# Patient Record
Sex: Male | Born: 1990 | Hispanic: No | Marital: Single
Health system: Southern US, Community
[De-identification: ages and names within clinical notes are randomized; demographics above are authoritative.]

## PROBLEM LIST (undated history)

## (undated) DIAGNOSIS — S069XAA Unspecified intracranial injury with loss of consciousness status unknown, initial encounter: Secondary | ICD-10-CM

## (undated) DIAGNOSIS — N289 Disorder of kidney and ureter, unspecified: Secondary | ICD-10-CM

## (undated) DIAGNOSIS — N200 Calculus of kidney: Secondary | ICD-10-CM

## (undated) HISTORY — DX: Disorder of kidney and ureter, unspecified: N28.9

## (undated) HISTORY — PX: RENAL ARTERY STENT: SHX2321

## (undated) HISTORY — DX: Calculus of kidney: N20.0

## (undated) HISTORY — PX: TONSILLECTOMY AND ADENOIDECTOMY: SUR1326

## (undated) HISTORY — DX: Unspecified intracranial injury with loss of consciousness status unknown, initial encounter: S06.9XAA

## (undated) HISTORY — PX: APPENDECTOMY: SHX54

---
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
## 2018-09-04 DIAGNOSIS — R000 Tachycardia, unspecified: Secondary | ICD-10-CM | POA: Diagnosis not present

## 2018-09-04 DIAGNOSIS — F1010 Alcohol abuse, uncomplicated: Secondary | ICD-10-CM

## 2018-09-04 DIAGNOSIS — L02419 Cutaneous abscess of limb, unspecified: Secondary | ICD-10-CM

## 2018-09-04 DIAGNOSIS — Z720 Tobacco use: Secondary | ICD-10-CM

## 2018-09-04 DIAGNOSIS — L03114 Cellulitis of left upper limb: Secondary | ICD-10-CM

## 2018-09-04 DIAGNOSIS — A419 Sepsis, unspecified organism: Secondary | ICD-10-CM | POA: Diagnosis not present

## 2018-09-05 DIAGNOSIS — L02419 Cutaneous abscess of limb, unspecified: Secondary | ICD-10-CM | POA: Diagnosis not present

## 2018-09-05 DIAGNOSIS — Z720 Tobacco use: Secondary | ICD-10-CM | POA: Diagnosis not present

## 2018-09-05 DIAGNOSIS — A419 Sepsis, unspecified organism: Secondary | ICD-10-CM | POA: Diagnosis not present

## 2018-09-05 DIAGNOSIS — F1010 Alcohol abuse, uncomplicated: Secondary | ICD-10-CM | POA: Diagnosis not present

## 2018-09-06 DIAGNOSIS — L02419 Cutaneous abscess of limb, unspecified: Secondary | ICD-10-CM | POA: Diagnosis not present

## 2018-09-06 DIAGNOSIS — Z720 Tobacco use: Secondary | ICD-10-CM | POA: Diagnosis not present

## 2018-09-06 DIAGNOSIS — A419 Sepsis, unspecified organism: Secondary | ICD-10-CM | POA: Diagnosis not present

## 2018-09-06 DIAGNOSIS — F1010 Alcohol abuse, uncomplicated: Secondary | ICD-10-CM | POA: Diagnosis not present

## 2018-09-07 DIAGNOSIS — Z720 Tobacco use: Secondary | ICD-10-CM | POA: Diagnosis not present

## 2018-09-07 DIAGNOSIS — A419 Sepsis, unspecified organism: Secondary | ICD-10-CM | POA: Diagnosis not present

## 2018-09-07 DIAGNOSIS — F1010 Alcohol abuse, uncomplicated: Secondary | ICD-10-CM | POA: Diagnosis not present

## 2018-09-07 DIAGNOSIS — L02419 Cutaneous abscess of limb, unspecified: Secondary | ICD-10-CM | POA: Diagnosis not present

## 2018-09-08 DIAGNOSIS — M71122 Other infective bursitis, left elbow: Secondary | ICD-10-CM

## 2018-09-08 DIAGNOSIS — Z720 Tobacco use: Secondary | ICD-10-CM | POA: Diagnosis not present

## 2018-09-08 DIAGNOSIS — L03114 Cellulitis of left upper limb: Secondary | ICD-10-CM | POA: Diagnosis not present

## 2018-09-08 DIAGNOSIS — L02419 Cutaneous abscess of limb, unspecified: Secondary | ICD-10-CM | POA: Diagnosis not present

## 2020-07-17 ENCOUNTER — Inpatient Hospital Stay (HOSPITAL_COMMUNITY): Payer: Self-pay

## 2020-07-17 ENCOUNTER — Emergency Department (HOSPITAL_COMMUNITY): Payer: Self-pay

## 2020-07-17 ENCOUNTER — Inpatient Hospital Stay (HOSPITAL_COMMUNITY): Payer: Self-pay | Admitting: Anesthesiology

## 2020-07-17 ENCOUNTER — Inpatient Hospital Stay (HOSPITAL_COMMUNITY)
Admission: EM | Admit: 2020-07-17 | Discharge: 2020-07-21 | DRG: 958 | Discharge status: Against Medical Advice | Payer: Self-pay | Attending: Surgery | Admitting: Surgery

## 2020-07-17 ENCOUNTER — Encounter (HOSPITAL_COMMUNITY): Admission: EM | Discharge status: Against Medical Advice | Payer: Self-pay | Source: Home / Self Care

## 2020-07-17 DIAGNOSIS — F1010 Alcohol abuse, uncomplicated: Secondary | ICD-10-CM | POA: Diagnosis present

## 2020-07-17 DIAGNOSIS — R402132 Coma scale, eyes open, to sound, at arrival to emergency department: Secondary | ICD-10-CM | POA: Diagnosis present

## 2020-07-17 DIAGNOSIS — I10 Essential (primary) hypertension: Secondary | ICD-10-CM | POA: Diagnosis present

## 2020-07-17 DIAGNOSIS — Z23 Encounter for immunization: Secondary | ICD-10-CM

## 2020-07-17 DIAGNOSIS — Y92410 Unspecified street and highway as the place of occurrence of the external cause: Secondary | ICD-10-CM

## 2020-07-17 DIAGNOSIS — T1490XA Injury, unspecified, initial encounter: Secondary | ICD-10-CM

## 2020-07-17 DIAGNOSIS — S270XXA Traumatic pneumothorax, initial encounter: Secondary | ICD-10-CM | POA: Diagnosis present

## 2020-07-17 DIAGNOSIS — S50812A Abrasion of left forearm, initial encounter: Secondary | ICD-10-CM | POA: Diagnosis present

## 2020-07-17 DIAGNOSIS — T148XXA Other injury of unspecified body region, initial encounter: Secondary | ICD-10-CM

## 2020-07-17 DIAGNOSIS — S069XAA Unspecified intracranial injury with loss of consciousness status unknown, initial encounter: Secondary | ICD-10-CM | POA: Diagnosis present

## 2020-07-17 DIAGNOSIS — R402352 Coma scale, best motor response, localizes pain, at arrival to emergency department: Secondary | ICD-10-CM | POA: Diagnosis present

## 2020-07-17 DIAGNOSIS — S2243XA Multiple fractures of ribs, bilateral, initial encounter for closed fracture: Secondary | ICD-10-CM | POA: Diagnosis present

## 2020-07-17 DIAGNOSIS — R402252 Coma scale, best verbal response, oriented, at arrival to emergency department: Secondary | ICD-10-CM | POA: Diagnosis present

## 2020-07-17 DIAGNOSIS — S2242XA Multiple fractures of ribs, left side, initial encounter for closed fracture: Secondary | ICD-10-CM

## 2020-07-17 DIAGNOSIS — S069X9A Unspecified intracranial injury with loss of consciousness of unspecified duration, initial encounter: Secondary | ICD-10-CM | POA: Diagnosis present

## 2020-07-17 DIAGNOSIS — E875 Hyperkalemia: Secondary | ICD-10-CM | POA: Diagnosis present

## 2020-07-17 DIAGNOSIS — S42022A Displaced fracture of shaft of left clavicle, initial encounter for closed fracture: Secondary | ICD-10-CM | POA: Diagnosis present

## 2020-07-17 DIAGNOSIS — S066X9A Traumatic subarachnoid hemorrhage with loss of consciousness of unspecified duration, initial encounter: Secondary | ICD-10-CM | POA: Diagnosis present

## 2020-07-17 DIAGNOSIS — D62 Acute posthemorrhagic anemia: Secondary | ICD-10-CM | POA: Diagnosis present

## 2020-07-17 DIAGNOSIS — Z5329 Procedure and treatment not carried out because of patient's decision for other reasons: Secondary | ICD-10-CM | POA: Diagnosis not present

## 2020-07-17 DIAGNOSIS — S27321A Contusion of lung, unilateral, initial encounter: Secondary | ICD-10-CM | POA: Diagnosis present

## 2020-07-17 DIAGNOSIS — Y907 Blood alcohol level of 200-239 mg/100 ml: Secondary | ICD-10-CM | POA: Diagnosis present

## 2020-07-17 DIAGNOSIS — S06351A Traumatic hemorrhage of left cerebrum with loss of consciousness of 30 minutes or less, initial encounter: Secondary | ICD-10-CM

## 2020-07-17 DIAGNOSIS — Z20822 Contact with and (suspected) exposure to covid-19: Secondary | ICD-10-CM | POA: Diagnosis present

## 2020-07-17 DIAGNOSIS — Z419 Encounter for procedure for purposes other than remedying health state, unspecified: Secondary | ICD-10-CM

## 2020-07-17 DIAGNOSIS — S31114A Laceration without foreign body of abdominal wall, left lower quadrant without penetration into peritoneal cavity, initial encounter: Secondary | ICD-10-CM | POA: Diagnosis present

## 2020-07-17 DIAGNOSIS — S06359A Traumatic hemorrhage of left cerebrum with loss of consciousness of unspecified duration, initial encounter: Principal | ICD-10-CM | POA: Diagnosis present

## 2020-07-17 HISTORY — PX: ORIF CLAVICULAR FRACTURE: SHX5055

## 2020-07-17 LAB — COMPREHENSIVE METABOLIC PANEL
ALT: 33 U/L (ref 0–44)
ALT: 35 U/L (ref 0–44)
AST: 35 U/L (ref 15–41)
AST: 36 U/L (ref 15–41)
Albumin: 3.9 g/dL (ref 3.5–5.0)
Albumin: 4.3 g/dL (ref 3.5–5.0)
Alkaline Phosphatase: 95 U/L (ref 38–126)
Alkaline Phosphatase: 93 U/L (ref 38–126)
Anion gap: 9 (ref 5–15)
Anion gap: 10 (ref 5–15)
BUN: 16 mg/dL (ref 6–20)
BUN: 17 mg/dL (ref 6–20)
CO2: 23 mmol/L (ref 22–32)
CO2: 23 mmol/L (ref 22–32)
Calcium: 8.6 mg/dL — ABNORMAL LOW (ref 8.9–10.3)
Calcium: 8.5 mg/dL — ABNORMAL LOW (ref 8.9–10.3)
Chloride: 103 mmol/L (ref 98–111)
Chloride: 102 mmol/L (ref 98–111)
Creatinine, Ser: 0.93 mg/dL (ref 0.61–1.24)
Creatinine, Ser: 1.03 mg/dL (ref 0.61–1.24)
GFR, Estimated: >60 mL/min (ref >60)
GFR, Estimated: >60 mL/min (ref >60)
Glucose, Bld: 122 mg/dL — ABNORMAL HIGH (ref 70–99)
Glucose, Bld: 122 mg/dL — ABNORMAL HIGH (ref 70–99)
Potassium: 4.2 mmol/L (ref 3.5–5.1)
Potassium: 6.5 mmol/L (ref 3.5–5.1)
Sodium: 135 mmol/L (ref 135–145)
Sodium: 135 mmol/L (ref 135–145)
Total Bilirubin: 0.7 mg/dL (ref 0.3–1.2)
Total Bilirubin: 0.2 mg/dL — ABNORMAL LOW (ref 0.3–1.2)
Total Protein: 6.2 g/dL — ABNORMAL LOW (ref 6.5–8.1)
Total Protein: 6.9 g/dL (ref 6.5–8.1)

## 2020-07-17 LAB — CBC
HCT: 45 % (ref 39.0–52.0)
HCT: 45.6 % (ref 39.0–52.0)
Hemoglobin: 14.9 g/dL (ref 13.0–17.0)
Hemoglobin: 15.7 g/dL (ref 13.0–17.0)
MCH: 32.4 pg (ref 26.0–34.0)
MCH: 33.6 pg (ref 26.0–34.0)
MCHC: 33.1 g/dL (ref 30.0–36.0)
MCHC: 34.4 g/dL (ref 30.0–36.0)
MCV: 97.8 fL (ref 80.0–100.0)
MCV: 97.6 fL (ref 80.0–100.0)
Platelets: 261 10*3/uL (ref 150–400)
Platelets: 300 10*3/uL (ref 150–400)
RBC: 4.6 MIL/uL (ref 4.22–5.81)
RBC: 4.67 MIL/uL (ref 4.22–5.81)
RDW: 12.1 % (ref 11.5–15.5)
RDW: 12.1 % (ref 11.5–15.5)
WBC: 13.9 10*3/uL — ABNORMAL HIGH (ref 4.0–10.5)
WBC: 20.5 10*3/uL — ABNORMAL HIGH (ref 4.0–10.5)
nRBC: 0 % (ref 0.0–0.2)
nRBC: 0 % (ref 0.0–0.2)

## 2020-07-17 LAB — PHOSPHORUS: Phosphorus: 2.3 mg/dL — ABNORMAL LOW (ref 2.5–4.6)

## 2020-07-17 LAB — SAMPLE TO BLOOD BANK

## 2020-07-17 LAB — BASIC METABOLIC PANEL
Anion gap: 13 (ref 5–15)
BUN: 19 mg/dL (ref 6–20)
CO2: 21 mmol/L — ABNORMAL LOW (ref 22–32)
Calcium: 8.4 mg/dL — ABNORMAL LOW (ref 8.9–10.3)
Chloride: 105 mmol/L (ref 98–111)
Creatinine, Ser: 0.97 mg/dL (ref 0.61–1.24)
GFR, Estimated: >60 mL/min (ref >60)
Glucose, Bld: 128 mg/dL — ABNORMAL HIGH (ref 70–99)
Potassium: 5.4 mmol/L — ABNORMAL HIGH (ref 3.5–5.1)
Sodium: 139 mmol/L (ref 135–145)

## 2020-07-17 LAB — I-STAT CHEM 8, ED
BUN: 19 mg/dL (ref 6–20)
Calcium, Ion: 1.08 mmol/L — ABNORMAL LOW (ref 1.15–1.40)
Chloride: 105 mmol/L (ref 98–111)
Creatinine, Ser: 1.2 mg/dL (ref 0.61–1.24)
Glucose, Bld: 122 mg/dL — ABNORMAL HIGH (ref 70–99)
HCT: 46 % (ref 39.0–52.0)
Hemoglobin: 15.6 g/dL (ref 13.0–17.0)
Potassium: 4.2 mmol/L (ref 3.5–5.1)
Sodium: 140 mmol/L (ref 135–145)
TCO2: 24 mmol/L (ref 22–32)

## 2020-07-17 LAB — PROTIME-INR
INR: 0.9 (ref 0.8–1.2)
Prothrombin Time: 11.5 seconds (ref 11.4–15.2)

## 2020-07-17 LAB — RESP PANEL BY RT-PCR (FLU A&B, COVID) ARPGX2
Influenza A by PCR: NEGATIVE
Influenza B by PCR: NEGATIVE
SARS Coronavirus 2 by RT PCR: NEGATIVE

## 2020-07-17 LAB — ETHANOL: Alcohol, Ethyl (B): 213 mg/dL — ABNORMAL HIGH (ref <10)

## 2020-07-17 LAB — MAGNESIUM: Magnesium: 2 mg/dL (ref 1.7–2.4)

## 2020-07-17 LAB — HIV ANTIBODY (ROUTINE TESTING W REFLEX): HIV Screen 4th Generation wRfx: NONREACTIVE

## 2020-07-17 LAB — LACTIC ACID, PLASMA: Lactic Acid, Venous: 2 mmol/L (ref 0.5–1.9)

## 2020-07-17 SURGERY — OPEN REDUCTION INTERNAL FIXATION (ORIF) CLAVICULAR FRACTURE
Anesthesia: General | Site: Shoulder | Laterality: Left

## 2020-07-17 MED ORDER — LORAZEPAM 1 MG PO TABS
1.0000 mg | ORAL_TABLET | ORAL | Status: AC | PRN
  Administered 2020-07-18 – 2020-07-20 (×5): 1 mg via ORAL
  Filled 2020-07-17 (×5): qty 1

## 2020-07-17 MED ORDER — LORAZEPAM 2 MG/ML IJ SOLN
0.0000 mg | Freq: Four times a day (QID) | INTRAMUSCULAR | Status: AC
  Administered 2020-07-18 (×2): 1 mg via INTRAVENOUS
  Filled 2020-07-17 (×2): qty 1

## 2020-07-17 MED ORDER — POTASSIUM CHLORIDE IN NACL 20-0.9 MEQ/L-% IV SOLN
INTRAVENOUS | Status: DC
  Filled 2020-07-17: qty 1000

## 2020-07-17 MED ORDER — VANCOMYCIN HCL 1000 MG IV SOLR
INTRAVENOUS | Status: AC
  Filled 2020-07-17: qty 1000

## 2020-07-17 MED ORDER — BACITRACIN ZINC 500 UNIT/GM EX OINT
TOPICAL_OINTMENT | CUTANEOUS | Status: DC | PRN
  Administered 2020-07-17: 1 via TOPICAL

## 2020-07-17 MED ORDER — FENTANYL CITRATE (PF) 250 MCG/5ML IJ SOLN
INTRAMUSCULAR | Status: DC | PRN
  Administered 2020-07-17 (×2): 50 ug via INTRAVENOUS
  Administered 2020-07-17: 100 ug via INTRAVENOUS

## 2020-07-17 MED ORDER — CHLORHEXIDINE GLUCONATE 4 % EX LIQD
60.0000 mL | Freq: Once | CUTANEOUS | Status: DC
  Filled 2020-07-17: qty 60

## 2020-07-17 MED ORDER — FOLIC ACID 1 MG PO TABS
1.0000 mg | ORAL_TABLET | Freq: Every day | ORAL | Status: DC
  Administered 2020-07-17 – 2020-07-21 (×5): 1 mg via ORAL
  Filled 2020-07-17 (×5): qty 1

## 2020-07-17 MED ORDER — ADULT MULTIVITAMIN W/MINERALS CH
1.0000 | ORAL_TABLET | Freq: Every day | ORAL | Status: DC
  Administered 2020-07-17 – 2020-07-21 (×5): 1 via ORAL
  Filled 2020-07-17 (×5): qty 1

## 2020-07-17 MED ORDER — OXYCODONE HCL 5 MG PO TABS
10.0000 mg | ORAL_TABLET | ORAL | Status: DC | PRN
  Administered 2020-07-17 – 2020-07-20 (×7): 10 mg via ORAL
  Filled 2020-07-17 (×7): qty 2

## 2020-07-17 MED ORDER — DEXMEDETOMIDINE (PRECEDEX) IN NS 20 MCG/5ML (4 MCG/ML) IV SYRINGE
PREFILLED_SYRINGE | INTRAVENOUS | Status: AC
  Filled 2020-07-17: qty 5

## 2020-07-17 MED ORDER — PHENYLEPHRINE 40 MCG/ML (10ML) SYRINGE FOR IV PUSH (FOR BLOOD PRESSURE SUPPORT)
PREFILLED_SYRINGE | INTRAVENOUS | Status: DC | PRN
  Administered 2020-07-17: 160 ug via INTRAVENOUS
  Administered 2020-07-17 (×2): 80 ug via INTRAVENOUS

## 2020-07-17 MED ORDER — ACETAMINOPHEN 325 MG PO TABS
650.0000 mg | ORAL_TABLET | Freq: Four times a day (QID) | ORAL | Status: DC
  Administered 2020-07-17 – 2020-07-19 (×7): 650 mg via ORAL
  Filled 2020-07-17 (×9): qty 2

## 2020-07-17 MED ORDER — LORAZEPAM 2 MG/ML IJ SOLN
0.0000 mg | Freq: Two times a day (BID) | INTRAMUSCULAR | Status: DC
  Administered 2020-07-19: 2 mg via INTRAVENOUS
  Administered 2020-07-20: 1 mg via INTRAVENOUS
  Administered 2020-07-20: 2 mg via INTRAVENOUS
  Filled 2020-07-17 (×2): qty 1

## 2020-07-17 MED ORDER — CELECOXIB 200 MG PO CAPS: 200.0000 mg | ORAL_CAPSULE | Freq: Once | ORAL | Status: DC

## 2020-07-17 MED ORDER — AMISULPRIDE (ANTIEMETIC) 5 MG/2ML IV SOLN: 10.0000 mg | Freq: Once | INTRAVENOUS | Status: DC | PRN

## 2020-07-17 MED ORDER — LEVETIRACETAM IN NACL 500 MG/100ML IV SOLN: 500.0000 mg | Freq: Two times a day (BID) | INTRAVENOUS | Status: DC

## 2020-07-17 MED ORDER — DEXMEDETOMIDINE (PRECEDEX) IN NS 20 MCG/5ML (4 MCG/ML) IV SYRINGE
PREFILLED_SYRINGE | INTRAVENOUS | Status: DC | PRN
  Administered 2020-07-17: 20 ug via INTRAVENOUS

## 2020-07-17 MED ORDER — 0.9 % SODIUM CHLORIDE (POUR BTL) OPTIME
TOPICAL | Status: DC | PRN
  Administered 2020-07-17: 1000 mL

## 2020-07-17 MED ORDER — LACTATED RINGERS IV SOLN: INTRAVENOUS | Status: DC

## 2020-07-17 MED ORDER — DEXAMETHASONE SODIUM PHOSPHATE 10 MG/ML IJ SOLN
INTRAMUSCULAR | Status: DC | PRN
  Administered 2020-07-17: 10 mg via INTRAVENOUS

## 2020-07-17 MED ORDER — FENTANYL CITRATE (PF) 100 MCG/2ML IJ SOLN
INTRAMUSCULAR | Status: AC
  Filled 2020-07-17: qty 2

## 2020-07-17 MED ORDER — ALBUMIN HUMAN 5 % IV SOLN: INTRAVENOUS | Status: DC | PRN

## 2020-07-17 MED ORDER — SUCCINYLCHOLINE CHLORIDE 20 MG/ML IJ SOLN
INTRAMUSCULAR | Status: DC | PRN
  Administered 2020-07-17: 180 mg via INTRAVENOUS

## 2020-07-17 MED ORDER — ONDANSETRON HCL 4 MG/2ML IJ SOLN
4.0000 mg | Freq: Four times a day (QID) | INTRAMUSCULAR | Status: DC | PRN
  Administered 2020-07-17: 4 mg via INTRAVENOUS
  Filled 2020-07-17 (×2): qty 2

## 2020-07-17 MED ORDER — THIAMINE HCL 100 MG PO TABS
100.0000 mg | ORAL_TABLET | Freq: Every day | ORAL | Status: DC
  Administered 2020-07-17 – 2020-07-21 (×5): 100 mg via ORAL
  Filled 2020-07-17 (×5): qty 1

## 2020-07-17 MED ORDER — ONDANSETRON HCL 4 MG/2ML IJ SOLN
INTRAMUSCULAR | Status: DC | PRN
  Administered 2020-07-17: 4 mg via INTRAVENOUS

## 2020-07-17 MED ORDER — PROPOFOL 10 MG/ML IV BOLUS
INTRAVENOUS | Status: AC
  Filled 2020-07-17: qty 20

## 2020-07-17 MED ORDER — TETANUS-DIPHTH-ACELL PERTUSSIS 5-2.5-18.5 LF-MCG/0.5 IM SUSY
0.5000 mL | PREFILLED_SYRINGE | Freq: Once | INTRAMUSCULAR | Status: AC
  Administered 2020-07-17: 0.5 mL via INTRAMUSCULAR
  Filled 2020-07-17: qty 0.5

## 2020-07-17 MED ORDER — LORAZEPAM 2 MG/ML IJ SOLN
1.0000 mg | INTRAMUSCULAR | Status: AC | PRN
  Filled 2020-07-17: qty 1

## 2020-07-17 MED ORDER — CEFAZOLIN SODIUM-DEXTROSE 2-4 GM/100ML-% IV SOLN
2.0000 g | INTRAVENOUS | Status: AC
  Administered 2020-07-17: 2 g via INTRAVENOUS
  Filled 2020-07-17: qty 100

## 2020-07-17 MED ORDER — ACETAMINOPHEN 10 MG/ML IV SOLN
INTRAVENOUS | Status: DC | PRN
  Administered 2020-07-17: 1000 mg via INTRAVENOUS

## 2020-07-17 MED ORDER — METHOCARBAMOL 500 MG PO TABS
750.0000 mg | ORAL_TABLET | Freq: Three times a day (TID) | ORAL | Status: DC
  Administered 2020-07-17 – 2020-07-19 (×6): 750 mg via ORAL
  Filled 2020-07-17 (×6): qty 2

## 2020-07-17 MED ORDER — THIAMINE HCL 100 MG/ML IJ SOLN: 100.0000 mg | Freq: Every day | INTRAMUSCULAR | Status: DC

## 2020-07-17 MED ORDER — DOCUSATE SODIUM 100 MG PO CAPS
100.0000 mg | ORAL_CAPSULE | Freq: Two times a day (BID) | ORAL | Status: DC
  Administered 2020-07-17 – 2020-07-21 (×9): 100 mg via ORAL
  Filled 2020-07-17 (×9): qty 1

## 2020-07-17 MED ORDER — CALCIUM GLUCONATE-NACL 1-0.675 GM/50ML-% IV SOLN
1.0000 g | Freq: Once | INTRAVENOUS | Status: AC
  Administered 2020-07-17: 1000 mg via INTRAVENOUS
  Filled 2020-07-17: qty 50

## 2020-07-17 MED ORDER — FENTANYL CITRATE (PF) 250 MCG/5ML IJ SOLN
INTRAMUSCULAR | Status: AC
  Filled 2020-07-17: qty 5

## 2020-07-17 MED ORDER — VANCOMYCIN HCL 1000 MG IV SOLR
INTRAVENOUS | Status: DC | PRN
  Administered 2020-07-17: 1000 mg

## 2020-07-17 MED ORDER — IOHEXOL 300 MG/ML  SOLN
100.0000 mL | Freq: Once | INTRAMUSCULAR | Status: AC | PRN
  Administered 2020-07-17: 100 mL via INTRAVENOUS

## 2020-07-17 MED ORDER — OXYCODONE HCL 5 MG PO TABS
5.0000 mg | ORAL_TABLET | ORAL | Status: DC | PRN
  Administered 2020-07-18 (×2): 5 mg via ORAL
  Filled 2020-07-17 (×2): qty 1

## 2020-07-17 MED ORDER — FENTANYL CITRATE (PF) 100 MCG/2ML IJ SOLN
25.0000 ug | INTRAMUSCULAR | Status: DC | PRN
  Administered 2020-07-17 (×2): 50 ug via INTRAVENOUS

## 2020-07-17 MED ORDER — ACETAMINOPHEN 500 MG PO TABS: 1000.0000 mg | ORAL_TABLET | Freq: Once | ORAL | Status: DC

## 2020-07-17 MED ORDER — CHLORHEXIDINE GLUCONATE 0.12 % MT SOLN
15.0000 mL | Freq: Once | OROMUCOSAL | Status: DC
  Filled 2020-07-17: qty 15

## 2020-07-17 MED ORDER — ACETAMINOPHEN 10 MG/ML IV SOLN
INTRAVENOUS | Status: AC
  Filled 2020-07-17: qty 100

## 2020-07-17 MED ORDER — CEFAZOLIN SODIUM-DEXTROSE 2-4 GM/100ML-% IV SOLN
2.0000 g | Freq: Three times a day (TID) | INTRAVENOUS | Status: AC
  Administered 2020-07-18 (×3): 2 g via INTRAVENOUS
  Filled 2020-07-17 (×3): qty 100

## 2020-07-17 MED ORDER — POVIDONE-IODINE 10 % EX SWAB: 2.0000 "application " | Freq: Once | CUTANEOUS | Status: DC

## 2020-07-17 MED ORDER — SODIUM CHLORIDE 0.9 % IV SOLN: INTRAVENOUS | Status: DC

## 2020-07-17 MED ORDER — ONDANSETRON 4 MG PO TBDP: 4.0000 mg | ORAL_TABLET | Freq: Four times a day (QID) | ORAL | Status: DC | PRN

## 2020-07-17 MED ORDER — KETOROLAC TROMETHAMINE 30 MG/ML IJ SOLN
30.0000 mg | Freq: Four times a day (QID) | INTRAMUSCULAR | Status: DC | PRN
  Administered 2020-07-17 – 2020-07-20 (×9): 30 mg via INTRAVENOUS
  Filled 2020-07-17 (×11): qty 1

## 2020-07-17 MED ORDER — NICOTINE 14 MG/24HR TD PT24
14.0000 mg | MEDICATED_PATCH | Freq: Every day | TRANSDERMAL | Status: DC
  Administered 2020-07-18 – 2020-07-21 (×5): 14 mg via TRANSDERMAL
  Filled 2020-07-17 (×5): qty 1

## 2020-07-17 MED ORDER — SUGAMMADEX SODIUM 200 MG/2ML IV SOLN
INTRAVENOUS | Status: DC | PRN
  Administered 2020-07-17: 200 mg via INTRAVENOUS

## 2020-07-17 MED ORDER — DEXAMETHASONE SODIUM PHOSPHATE 10 MG/ML IJ SOLN
INTRAMUSCULAR | Status: AC
  Filled 2020-07-17: qty 1

## 2020-07-17 MED ORDER — LEVETIRACETAM IN NACL 500 MG/100ML IV SOLN
500.0000 mg | Freq: Two times a day (BID) | INTRAVENOUS | Status: DC
Start: 2020-07-17 — End: 2020-07-19
  Administered 2020-07-17 – 2020-07-19 (×5): 500 mg via INTRAVENOUS
  Filled 2020-07-17 (×6): qty 100

## 2020-07-17 MED ORDER — LIDOCAINE 2% (20 MG/ML) 5 ML SYRINGE
INTRAMUSCULAR | Status: DC | PRN
  Administered 2020-07-17: 100 mg via INTRAVENOUS

## 2020-07-17 MED ORDER — MORPHINE SULFATE (PF) 4 MG/ML IV SOLN
4.0000 mg | INTRAVENOUS | Status: DC | PRN
  Administered 2020-07-17 – 2020-07-19 (×9): 4 mg via INTRAVENOUS
  Filled 2020-07-17 (×11): qty 1

## 2020-07-17 MED ORDER — PROPOFOL 10 MG/ML IV BOLUS
INTRAVENOUS | Status: DC | PRN
Start: 2020-07-17 — End: 2020-07-17
  Administered 2020-07-17: 120 mg via INTRAVENOUS

## 2020-07-17 MED ORDER — FENTANYL CITRATE (PF) 100 MCG/2ML IJ SOLN
100.0000 ug | Freq: Once | INTRAMUSCULAR | Status: AC
Start: 2020-07-17 — End: 2020-07-17
  Administered 2020-07-17: 100 ug via INTRAVENOUS
  Filled 2020-07-17: qty 2

## 2020-07-17 MED ORDER — ROCURONIUM BROMIDE 10 MG/ML (PF) SYRINGE
PREFILLED_SYRINGE | INTRAVENOUS | Status: DC | PRN
  Administered 2020-07-17: 40 mg via INTRAVENOUS

## 2020-07-17 MED ORDER — METOPROLOL TARTRATE 5 MG/5ML IV SOLN: 5.0000 mg | Freq: Four times a day (QID) | INTRAVENOUS | Status: DC | PRN

## 2020-07-17 SURGICAL SUPPLY — 44 items
ADH SKN CLS LQ APL DERMABOND (GAUZE/BANDAGES/DRESSINGS) ×1
APL PRP STRL LF DISP 70% ISPRP (MISCELLANEOUS) ×1
BIT DRILL CALIBRATED 2.7 (BIT) ×1 IMPLANT
CANISTER SUCT 3000ML PPV (MISCELLANEOUS) ×1 IMPLANT
CHLORAPREP W/TINT 26 (MISCELLANEOUS) ×2 IMPLANT
COVER SURGICAL LIGHT HANDLE (MISCELLANEOUS) ×3 IMPLANT
DERMABOND ADHESIVE PROPEN (GAUZE/BANDAGES/DRESSINGS) ×1
DERMABOND ADVANCED .7 DNX6 (GAUZE/BANDAGES/DRESSINGS) IMPLANT
DRAPE C-ARM 42X72 X-RAY (DRAPES) ×2 IMPLANT
DRAPE INCISE IOBAN 66X45 STRL (DRAPES) ×2 IMPLANT
DRAPE ORTHO SPLIT 77X108 STRL (DRAPES) ×4
DRAPE SURG ORHT 6 SPLT 77X108 (DRAPES) ×2 IMPLANT
DRAPE U-SHAPE 47X51 STRL (DRAPES) ×4 IMPLANT
DRSG MEPILEX BORDER 4X8 (GAUZE/BANDAGES/DRESSINGS) ×1 IMPLANT
ELECT REM PT RETURN 9FT ADLT (ELECTROSURGICAL) ×2
ELECTRODE REM PT RTRN 9FT ADLT (ELECTROSURGICAL) ×1 IMPLANT
GLOVE BIO SURGEON STRL SZ 6.5 (GLOVE) ×6 IMPLANT
GLOVE BIO SURGEON STRL SZ7.5 (GLOVE) ×6 IMPLANT
GLOVE BIOGEL PI IND STRL 7.5 (GLOVE) ×1 IMPLANT
GLOVE BIOGEL PI INDICATOR 7.5 (GLOVE) ×1
GLOVE SURG UNDER POLY LF SZ6.5 (GLOVE) ×2 IMPLANT
GOWN STRL REUS W/ TWL LRG LVL3 (GOWN DISPOSABLE) ×2 IMPLANT
GOWN STRL REUS W/TWL LRG LVL3 (GOWN DISPOSABLE) ×4
KIT BASIN OR (CUSTOM PROCEDURE TRAY) ×2 IMPLANT
KIT TURNOVER KIT B (KITS) ×2 IMPLANT
NS IRRIG 1000ML POUR BTL (IV SOLUTION) ×2 IMPLANT
PACK GENERAL/GYN (CUSTOM PROCEDURE TRAY) ×2 IMPLANT
PAD ARMBOARD 7.5X6 YLW CONV (MISCELLANEOUS) ×4 IMPLANT
PLATE CLAV SUP LT 10H 110MM NS (Plate) ×1 IMPLANT
SCREW CORT LP 3.5X12 (Screw) ×1 IMPLANT
SCREW CORT LP 3.5X14 (Screw) ×3 IMPLANT
SCREW CORT LP T15 3.5X16 (Screw) ×4 IMPLANT
SLING ARM IMMOBILIZER LRG (SOFTGOODS) ×1 IMPLANT
STAPLER VISISTAT 35W (STAPLE) ×2 IMPLANT
SUCTION FRAZIER HANDLE 10FR (MISCELLANEOUS) ×2
SUCTION TUBE FRAZIER 10FR DISP (MISCELLANEOUS) ×1 IMPLANT
SUT ETHILON 3 0 PS 1 (SUTURE) ×1 IMPLANT
SUT MNCRL AB 3-0 PS2 18 (SUTURE) ×1 IMPLANT
SUT VIC AB 0 CT1 27 (SUTURE) ×2
SUT VIC AB 0 CT1 27XBRD ANBCTR (SUTURE) ×1 IMPLANT
SUT VIC AB 2-0 CT1 27 (SUTURE) ×2
SUT VIC AB 2-0 CT1 TAPERPNT 27 (SUTURE) ×1 IMPLANT
TOWEL GREEN STERILE (TOWEL DISPOSABLE) ×2 IMPLANT
WATER STERILE IRR 1000ML POUR (IV SOLUTION) ×2 IMPLANT
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
## 2020-07-17 NOTE — ED Notes (Signed)
Consent obtained for procedure & is at bedside.

## 2020-07-17 NOTE — Anesthesia Postprocedure Evaluation (Signed)
Anesthesia Post Note  Patient: Jerry Lewis  Procedure(s) Performed: OPEN REDUCTION INTERNAL FIXATION (ORIF) CLAVICULAR FRACTURE (Left Shoulder)     Patient location during evaluation: PACU Anesthesia Type: General Level of consciousness: sedated Pain management: pain level controlled Vital Signs Assessment: post-procedure vital signs reviewed and stable Respiratory status: spontaneous breathing and respiratory function stable Cardiovascular status: stable Postop Assessment: no apparent nausea or vomiting Anesthetic complications: no   No complications documented.  Last Vitals:  Vitals:   07/17/20 1615 07/17/20 1630  BP: (!) 136/96 (!) 135/100  Pulse: (!) 104 (!) 105  Resp: 16 17  Temp: (!) 36.3 C   SpO2: 98% 98%    Last Pain:  Vitals:   07/17/20 1615  TempSrc:   PainSc: Asleep                 Jesica Goheen DANIEL
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
## 2020-07-17 NOTE — ED Notes (Signed)
X ray in room.

## 2020-07-17 NOTE — Transfer of Care (Signed)
Immediate Anesthesia Transfer of Care Note  Patient: Jerry Lewis  Procedure(s) Performed: OPEN REDUCTION INTERNAL FIXATION (ORIF) CLAVICULAR FRACTURE (Left Shoulder)  Patient Location: PACU  Anesthesia Type:General  Level of Consciousness: awake, alert  and oriented  Airway & Oxygen Therapy: Patient Spontanous Breathing  Post-op Assessment: Report given to RN and Post -op Vital signs reviewed and stable  Post vital signs: Reviewed and stable  Last Vitals:  Vitals Value Taken Time  BP 149/90 07/17/20 1533  Temp    Pulse 109 07/17/20 1535  Resp 16 07/17/20 1535  SpO2 97 % 07/17/20 1535  Vitals shown include unvalidated device data.  Last Pain:  Vitals:   07/17/20 0936  TempSrc:   PainSc: Asleep         Complications: No complications documented.
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
## 2020-07-17 NOTE — ED Notes (Signed)
Patient transported to CT 

## 2020-07-17 NOTE — ED Notes (Signed)
Breakfast Ordered 
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
## 2020-07-17 NOTE — Progress Notes (Signed)
Pt arrived to 4e from PACU. Pt oriented to room and staff. Vitals taken and stable. Full body assessment done. Scattered bruising, scratch marks, and abrasions all over body. Left clavicle incision with foam that is clean and intact. Left eye has significant bruising and edema. Left lower abdomen with saturated dressing and significant, soft hematoma surrounding site. Pt states that area just below hematoma "feels numb". Otherwise no other symptoms. Vitals stable. PACU nurse assisted with calling trauma and ortho services. Staff was advised to reinforce dressing and continue to monitor.

## 2020-07-17 NOTE — ED Notes (Signed)
ED Provider at bedside. 
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
## 2020-07-17 NOTE — Anesthesia Preprocedure Evaluation (Addendum)
Anesthesia Evaluation  Patient identified by MRN, date of birth, ID band Patient awake    Reviewed: Allergy & Precautions, H&P , NPO status , Patient's Chart, lab work & pertinent test results  History of Anesthesia Complications Negative for: history of anesthetic complications  Airway Mallampati: III  TM Distance: >3 FB Neck ROM: Full    Dental  (+) Poor Dentition, Dental Advisory Given   Pulmonary neg pulmonary ROS,  Rib fx   Pulmonary exam normal        Cardiovascular negative cardio ROS Normal cardiovascular exam     Neuro/Psych TBI Cleared by NSG negative psych ROS   GI/Hepatic negative GI ROS, Neg liver ROS,   Endo/Other  negative endocrine ROS  Renal/GU negative Renal ROS  negative genitourinary   Musculoskeletal negative musculoskeletal ROS (+)   Abdominal   Peds negative pediatric ROS (+)  Hematology negative hematology ROS (+)   Anesthesia Other Findings   Reproductive/Obstetrics negative OB ROS                            Anesthesia Physical Anesthesia Plan  ASA: III  Anesthesia Plan: General   Post-op Pain Management:    Induction: Intravenous  PONV Risk Score and Plan: 3 and Ondansetron, Dexamethasone and Midazolam  Airway Management Planned: Oral ETT  Additional Equipment:   Intra-op Plan:   Post-operative Plan: Extubation in OR  Informed Consent: I have reviewed the patients History and Physical, chart, labs and discussed the procedure including the risks, benefits and alternatives for the proposed anesthesia with the patient or authorized representative who has indicated his/her understanding and acceptance.     Dental advisory given  Plan Discussed with: Anesthesiologist, CRNA and Surgeon  Anesthesia Plan Comments:        Anesthesia Quick Evaluation
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
## 2020-07-17 NOTE — Anesthesia Procedure Notes (Signed)
Procedure Name: Intubation Date/Time: 07/17/2020 1:55 PM Performed by: Onnie Boer, CRNA Pre-anesthesia Checklist: Patient identified, Emergency Drugs available, Suction available and Patient being monitored Patient Re-evaluated:Patient Re-evaluated prior to induction Oxygen Delivery Method: Circle system utilized Preoxygenation: Pre-oxygenation with 100% oxygen Induction Type: IV induction Ventilation: Mask ventilation without difficulty Laryngoscope Size: Miller and 2 Grade View: Grade I Tube type: Oral Tube size: 7.5 mm Number of attempts: 1 Airway Equipment and Method: Stylet Placement Confirmation: ETT inserted through vocal cords under direct vision,  positive ETCO2 and breath sounds checked- equal and bilateral Secured at: 21 cm Tube secured with: Tape Dental Injury: Teeth and Oropharynx as per pre-operative assessment

## 2020-07-17 NOTE — Progress Notes (Signed)
     Paged by Maury Dus to come see patient due to complaints of left leg numbness and repeated left lower abdomen/flank saturated dressings. The PACU nurse told the floor nurse that there was a laceration that had been closed with Nylon suture. The op note by Dr. Jena Gauss and the ER note do not mention closing this laceration.   Upon arrival, I could see that the ABDs placed over the wound were 50% saturated with bright red blood and the Mepilex beneath those was 100% saturated with blood. Several large clots were also seen. The laceration is ~4cm long and surrounded by a large hematoma that is outlined by a black marker. It is actively bleeding from the center of the laceration. I tried holding pressure against the wound for several minutes but it continued to bleed. It is very TTP.   I also assessed his reported numbness in the left leg. He reports that it actually starts up by the left lower abdominal wound and travels down his inguinal crease and to his left knee. He reports it started in the last few hours and is getting worse. Compartments are soft, PT pulse palpated, dorsiflexion/plantarflexion intact, able to lift his left leg off the bed. Patchy decreased sensation that is worse proximally around the level of the inguinal crease.   After discussing the situation with Dr. Eulah Pont, we believe the best thing to do right now is put a pressure dressing on the wound. This is likely just superficial bleeding as the CT scan from the ED showed no intraperitoneal involvement. The left leg numbness is likely transient from the hematoma. I redressed the abdominal wound with 3 ABDs and used Medipore tape to hold them in place. The nurse and I placed blankets up against the patient's left side to apply more pressure to the wound.   He also reported the pain medicine he was receiving wasn't helping his pain. He has Tylenol q6h, Morphine q2h PRN, Oxy q4h PRN. I added Toradol q6h PRN.   The nurse knows to contact  me if anything changes.    Jenne Pane, PA-C Office/Answering Service [phone] 07/17/2020, 10:45 PM

## 2020-07-17 NOTE — ED Notes (Signed)
Patient back from CT. Using personal cell phone, NAD noted.

## 2020-07-17 NOTE — ED Notes (Signed)
Pt transferred to short stay, Mom would like a call upon procedure end.
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
## 2020-07-17 NOTE — Progress Notes (Signed)
Repeat Ct brain reviewed. Grossly stable tSAH/small IPH. Cont current treatment, keppra x7 days.  Ok for DVT ppx starting tomorrow 07/18/2020 from Nsx standpoint.

## 2020-07-17 NOTE — Progress Notes (Addendum)
K noted to be 6.5 on AM labs. I have d/c IVF with K added. Written for NS infusion. EKG now. Repeat BMP to ensure hyperkalemia is not from hemolysis. Ordered calcium gluconate. I discussed with patients RN. Our team will follow up on repeat BMP and EKG.  Ortho planning for possible OR later today I have reached out to NSGY, Dr. Jake Samples, to see if he would be cleared from their standpoint for Ortho to take to the OR.

## 2020-07-17 NOTE — ED Notes (Signed)
Dr. Blinda Leatherwood contacted regarding lactic acid results. No further orders at this time.

## 2020-07-17 NOTE — ED Notes (Signed)
Provider at bedside

## 2020-07-18 ENCOUNTER — Inpatient Hospital Stay (HOSPITAL_COMMUNITY): Payer: Self-pay

## 2020-07-18 LAB — CBC
HCT: 33.3 % — ABNORMAL LOW (ref 39.0–52.0)
Hemoglobin: 11.7 g/dL — ABNORMAL LOW (ref 13.0–17.0)
MCH: 33.3 pg (ref 26.0–34.0)
MCHC: 35.1 g/dL (ref 30.0–36.0)
MCV: 94.9 fL (ref 80.0–100.0)
Platelets: 217 10*3/uL (ref 150–400)
RBC: 3.51 MIL/uL — ABNORMAL LOW (ref 4.22–5.81)
RDW: 12.1 % (ref 11.5–15.5)
WBC: 13.3 10*3/uL — ABNORMAL HIGH (ref 4.0–10.5)
nRBC: 0 % (ref 0.0–0.2)

## 2020-07-18 LAB — BASIC METABOLIC PANEL
Anion gap: 14 (ref 5–15)
BUN: 13 mg/dL (ref 6–20)
CO2: 23 mmol/L (ref 22–32)
Calcium: 8.4 mg/dL — ABNORMAL LOW (ref 8.9–10.3)
Chloride: 97 mmol/L — ABNORMAL LOW (ref 98–111)
Creatinine, Ser: 1 mg/dL (ref 0.61–1.24)
GFR, Estimated: >60 mL/min (ref >60)
Glucose, Bld: 137 mg/dL — ABNORMAL HIGH (ref 70–99)
Potassium: 3.8 mmol/L (ref 3.5–5.1)
Sodium: 134 mmol/L — ABNORMAL LOW (ref 135–145)

## 2020-07-18 LAB — VITAMIN D 25 HYDROXY (VIT D DEFICIENCY, FRACTURES): Vit D, 25-Hydroxy: 18.52 ng/mL — ABNORMAL LOW (ref 30–100)

## 2020-07-18 MED ORDER — VITAMIN D 25 MCG (1000 UNIT) PO TABS
1000.0000 [IU] | ORAL_TABLET | Freq: Two times a day (BID) | ORAL | Status: DC
  Administered 2020-07-18 – 2020-07-21 (×7): 1000 [IU] via ORAL
  Filled 2020-07-18 (×7): qty 1
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
## 2020-07-18 NOTE — Progress Notes (Signed)
SPORTS MEDICINE AND JOINT REPLACEMENT  Georgena Spurling, MD    Laurier Nancy, PA-C [address] [place], [place], [place]  [address]                             [phone]   PROGRESS NOTE  Subjective:  negative for Chest Pain  negative for Shortness of Breath  negative for Nausea/Vomiting   negative for Calf Pain  negative for Bowel Movement   Tolerating Diet: yes         Patient reports pain as 5 on 0-10 scale.    Objective: Vital signs in last 24 hours:   Patient Vitals for the past 24 hrs:  BP Temp Temp src Pulse Resp SpO2 Height Weight  07/18/20 0729 134/73 98 F (36.7 C) Oral -- -- -- -- --  07/18/20 0515 138/76 98.2 F (36.8 C) Oral (!) 102 16 99 % -- --  07/17/20 2346 138/77 98.6 F (37 C) Oral (!) 108 17 -- -- --  07/17/20 2022 (!) 141/86 99.1 F (37.3 C) Oral (!) 110 18 99 % -- --  07/17/20 1852 (!) 141/91 -- -- -- 16 99 % -- --  07/17/20 1826 -- -- -- (!) 102 -- -- -- --  07/17/20 1750 (!) 141/99 99 F (37.2 C) Oral 99 15 100 % -- --  07/17/20 1630 (!) 135/100 (!) 97.3 F (36.3 C) -- (!) 105 17 98 % -- --  07/17/20 1615 (!) 136/96 (!) 97.3 F (36.3 C) -- (!) 104 16 98 % -- --  07/17/20 1600 (!) 131/100 -- -- (!) 104 17 97 % -- --  07/17/20 1545 138/88 -- -- (!) 109 (!) 21 97 % -- --  07/17/20 1533 (!) 149/90 (!) 97.2 F (36.2 C) -- (!) 120 18 93 % -- --  07/17/20 1224 -- -- -- -- -- -- 6' 2.02" (1.88 m) 99.8 kg  07/17/20 1145 121/82 97.8 F (36.6 C) -- (!) 101 17 95 % -- --  07/17/20 1130 108/78 -- -- (!) 101 18 94 % -- --  07/17/20 1115 116/77 -- -- (!) 107 17 94 % -- --  07/17/20 1100 114/76 -- -- (!) 103 19 93 % -- --  07/17/20 1045 112/79 -- -- (!) 101 18 94 % -- --    @flow {1959:LAST@   Intake/Output from previous day:   02/18 0701 - 02/19 0700 In: 2011.1 [P.O.:240; I.V.:1201.1] Out: 2480 [Urine:2450]   Intake/Output this shift:   02/19 0701 - 02/19 1900 In: 710.7 [I.V.:310.7] Out: -    Intake/Output      02/18 0701 02/19 0700 02/19  0701 02/20 0700   P.O. 240    I.V. (mL/kg) 1201.1 (12) 310.7 (3.1)   Other 220    IV Piggyback 350 400   Total Intake(mL/kg) 2011.1 (20.2) 710.7 (7.1)   Urine (mL/kg/hr) 2450 (1)    Blood 30    Total Output 2480    Net -468.9 +710.7           LABORATORY DATA: Recent Labs    07/17/20 0336 07/17/20 0342 07/17/20 0706 07/18/20 0159  WBC 13.9*  --  20.5* 13.3*  HGB 14.9 15.6 15.7 11.7*  HCT 45.0 46.0 45.6 33.3*  PLT 261  --  300 217   Recent Labs    07/17/20 0336 07/17/20 0342 07/17/20 0706 07/17/20 1003 07/18/20 0159  NA 135 140 135 139 134*  K 4.2 4.2 6.5* 5.4*  3.8  CL 103 105 102 105 97*  CO2 23  --  23 21* 23  BUN 16 19 17 19 13   CREATININE 0.93 1.20 1.03 0.97 1.00  GLUCOSE 122* 122* 122* 128* 137*  CALCIUM 8.6*  --  8.5* 8.4* 8.4*   Lab Results  Component Value Date   INR 0.9 07/17/2020    Examination:  General appearance: alert, cooperative and no distress Extremities: extremities normal, atraumatic, no cyanosis or edema  Wound Exam: clean, dry, intact   Drainage:  Scant/small amount Serous exudate    Assessment:    1 Day Post-Op  Procedure(s) (LRB): OPEN REDUCTION INTERNAL FIXATION (ORIF) CLAVICULAR FRACTURE (Left)  ADDITIONAL DIAGNOSIS:  Active Problems:   TBI (traumatic brain injury) (HCC)     Plan: Physical Therapy as ordered Non Weight Bearing (NWB)  LUE  Bandage changed on L hip last night with no new drainage.   Patient doing well and A/O talking to me while sitting up in bed. Ok to start Lovenox for DVT prophylaxis once cleared by trauma/neuro to do so. Wear sling for comfort. Ortho will continue to follow    07/19/2020 07/18/2020, 10:33 AM

## 2020-07-18 NOTE — Plan of Care (Signed)
  Problem: Education: Goal: Knowledge of General Education information will improve Description Including pain rating scale, medication(s)/side effects and non-pharmacologic comfort measures Outcome: Progressing   
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
## 2020-07-19 LAB — BASIC METABOLIC PANEL
Anion gap: 8 (ref 5–15)
BUN: 13 mg/dL (ref 6–20)
CO2: 28 mmol/L (ref 22–32)
Calcium: 8.4 mg/dL — ABNORMAL LOW (ref 8.9–10.3)
Chloride: 102 mmol/L (ref 98–111)
Creatinine, Ser: 0.94 mg/dL (ref 0.61–1.24)
GFR, Estimated: >60 mL/min (ref >60)
Glucose, Bld: 95 mg/dL (ref 70–99)
Potassium: 3.5 mmol/L (ref 3.5–5.1)
Sodium: 138 mmol/L (ref 135–145)

## 2020-07-19 LAB — CBC
HCT: 34.4 % — ABNORMAL LOW (ref 39.0–52.0)
Hemoglobin: 11.2 g/dL — ABNORMAL LOW (ref 13.0–17.0)
MCH: 31.9 pg (ref 26.0–34.0)
MCHC: 32.6 g/dL (ref 30.0–36.0)
MCV: 98 fL (ref 80.0–100.0)
Platelets: 224 10*3/uL (ref 150–400)
RBC: 3.51 MIL/uL — ABNORMAL LOW (ref 4.22–5.81)
RDW: 12.1 % (ref 11.5–15.5)
WBC: 8.6 10*3/uL (ref 4.0–10.5)
nRBC: 0 % (ref 0.0–0.2)

## 2020-07-19 MED ORDER — METHOCARBAMOL 500 MG PO TABS
1000.0000 mg | ORAL_TABLET | Freq: Three times a day (TID) | ORAL | Status: DC
  Administered 2020-07-19 – 2020-07-21 (×6): 1000 mg via ORAL
  Filled 2020-07-19 (×6): qty 2

## 2020-07-19 MED ORDER — ACETAMINOPHEN 500 MG PO TABS
1000.0000 mg | ORAL_TABLET | Freq: Four times a day (QID) | ORAL | Status: DC
  Administered 2020-07-19 – 2020-07-21 (×7): 1000 mg via ORAL
  Filled 2020-07-19 (×7): qty 2

## 2020-07-19 MED ORDER — POLYETHYLENE GLYCOL 3350 17 G PO PACK
17.0000 g | PACK | Freq: Every day | ORAL | Status: DC
  Administered 2020-07-19 – 2020-07-20 (×2): 17 g via ORAL
  Filled 2020-07-19 (×2): qty 1

## 2020-07-19 MED ORDER — MORPHINE SULFATE (PF) 2 MG/ML IV SOLN
2.0000 mg | Freq: Four times a day (QID) | INTRAVENOUS | Status: DC | PRN
  Administered 2020-07-19 – 2020-07-20 (×3): 2 mg via INTRAVENOUS
  Filled 2020-07-19 (×4): qty 1

## 2020-07-19 MED ORDER — LEVETIRACETAM 500 MG PO TABS
500.0000 mg | ORAL_TABLET | Freq: Two times a day (BID) | ORAL | Status: DC
  Administered 2020-07-19 – 2020-07-21 (×4): 500 mg via ORAL
  Filled 2020-07-19 (×4): qty 1

## 2020-07-19 MED ORDER — LACTATED RINGERS IV BOLUS: 1000.0000 mL | Freq: Three times a day (TID) | INTRAVENOUS | Status: DC | PRN

## 2020-07-19 NOTE — Plan of Care (Signed)
?  Problem: Clinical Measurements: ?Goal: Will remain free from infection ?Outcome: Progressing ?  ?
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
## 2020-07-19 NOTE — Progress Notes (Signed)
SLP Cancellation Note  Patient Details Name: Jerry Lewis MRN: [id number] DOB: 1990/12/28   Cancelled treatment:       Reason Eval/Treat Not Completed: Other (comment) Attempted to see pt for cognitive evaluation but he was working with another provider. Will f/u as able.     Mahala Menghini., M.A. CCC-SLP Acute Rehabilitation Services Pager [phone] Office [phone]  07/19/2020, 3:27 PM

## 2020-07-19 NOTE — Progress Notes (Signed)
Pt. Was asked about bath today, pt stated that he was given a shower the day before but no record of documentation, pt was educated about not being able to take a shower because of current onset conditions and dressings, pt was asked about being given a BB but pt refused x'3
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
## 2020-07-19 NOTE — Progress Notes (Signed)
   ORTHOPAEDIC PROGRESS NOTE  s/p Procedure(s): OPEN REDUCTION INTERNAL FIXATION (ORIF) CLAVICULAR FRACTURE  SUBJECTIVE: Reports mild pain about operative site. Chest pain and abdominal pain as expected due to injuries  No SOB. No nausea/vomiting. Left clavicle is not symptomatic   More abdominal and rib complaints  OBJECTIVE: PE:  Vitals:   07/19/20 0333 07/19/20 0853  BP: (!) 143/94 137/77  Pulse: (!) 102 (!) 109  Resp: 20 16  Temp: 98.4 F (36.9 C) 98.1 F (36.7 C)  SpO2: 98% 95%   Left clavicle dressed with minimal drainage.  Left upper extremity is neurovascularly intact  ASSESSMENT: Jerry Lewis is a 30 y.o. male doing well postoperatively.  PLAN: Weightbearing: NWB LUE Insicional and dressing care: Dressings left intact until follow-up Orthopedic device(s): none Showering:  VTE prophylaxis: on hold now due to TBI  Pain control: pain is controlled   Patient is sleepy but easily arousable Follow - up plan: continue management per trauma and neurosurgery Contact information:    Akiva Brassfield A. Gwinda Passe Physician Assistant [facility] [phone]  07/19/2020, 11:24 AM  Patient ID: Jerry Lewis, male   DOB: August 15, 1990, 30 y.o.   MRN: [id number]
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
## 2020-07-20 ENCOUNTER — Encounter (HOSPITAL_COMMUNITY): Payer: Self-pay | Admitting: Student

## 2020-07-20 DIAGNOSIS — S069X9A Unspecified intracranial injury with loss of consciousness of unspecified duration, initial encounter: Secondary | ICD-10-CM

## 2020-07-20 LAB — BASIC METABOLIC PANEL
Anion gap: 9 (ref 5–15)
BUN: 12 mg/dL (ref 6–20)
CO2: 27 mmol/L (ref 22–32)
Calcium: 8.8 mg/dL — ABNORMAL LOW (ref 8.9–10.3)
Chloride: 102 mmol/L (ref 98–111)
Creatinine, Ser: 0.86 mg/dL (ref 0.61–1.24)
GFR, Estimated: >60 mL/min (ref >60)
Glucose, Bld: 100 mg/dL — ABNORMAL HIGH (ref 70–99)
Potassium: 3.9 mmol/L (ref 3.5–5.1)
Sodium: 138 mmol/L (ref 135–145)

## 2020-07-20 LAB — CBC
HCT: 32.6 % — ABNORMAL LOW (ref 39.0–52.0)
Hemoglobin: 10.7 g/dL — ABNORMAL LOW (ref 13.0–17.0)
MCH: 31.9 pg (ref 26.0–34.0)
MCHC: 32.8 g/dL (ref 30.0–36.0)
MCV: 97.3 fL (ref 80.0–100.0)
Platelets: 227 10*3/uL (ref 150–400)
RBC: 3.35 MIL/uL — ABNORMAL LOW (ref 4.22–5.81)
RDW: 12 % (ref 11.5–15.5)
WBC: 7.6 10*3/uL (ref 4.0–10.5)
nRBC: 0 % (ref 0.0–0.2)

## 2020-07-20 MED ORDER — ENOXAPARIN SODIUM 30 MG/0.3ML ~~LOC~~ SOLN
30.0000 mg | Freq: Two times a day (BID) | SUBCUTANEOUS | Status: DC
  Administered 2020-07-20 – 2020-07-21 (×3): 30 mg via SUBCUTANEOUS
  Filled 2020-07-20 (×3): qty 0.3

## 2020-07-20 MED ORDER — LORAZEPAM 1 MG PO TABS
1.0000 mg | ORAL_TABLET | ORAL | Status: DC | PRN
  Administered 2020-07-21: 1 mg via ORAL
  Filled 2020-07-20: qty 1

## 2020-07-20 MED ORDER — ENOXAPARIN SODIUM 40 MG/0.4ML ~~LOC~~ SOLN: 40.0000 mg | Freq: Two times a day (BID) | SUBCUTANEOUS | Status: DC

## 2020-07-20 MED ORDER — QUETIAPINE FUMARATE 25 MG PO TABS
25.0000 mg | ORAL_TABLET | Freq: Every evening | ORAL | Status: DC | PRN
Start: 2020-07-20 — End: 2020-07-21

## 2020-07-20 MED ORDER — BISACODYL 10 MG RE SUPP
10.0000 mg | Freq: Every day | RECTAL | Status: DC | PRN
  Administered 2020-07-21: 10 mg via RECTAL

## 2020-07-20 MED ORDER — OXYCODONE HCL 5 MG PO TABS
5.0000 mg | ORAL_TABLET | ORAL | Status: DC | PRN
  Administered 2020-07-20 – 2020-07-21 (×4): 10 mg via ORAL
  Filled 2020-07-20 (×4): qty 2

## 2020-07-20 MED ORDER — LORAZEPAM 2 MG/ML IJ SOLN: 1.0000 mg | INTRAMUSCULAR | Status: DC | PRN

## 2020-07-20 MED ORDER — POLYETHYLENE GLYCOL 3350 17 G PO PACK
17.0000 g | PACK | Freq: Two times a day (BID) | ORAL | Status: DC
  Administered 2020-07-20 – 2020-07-21 (×2): 17 g via ORAL
  Filled 2020-07-20 (×2): qty 1
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
## 2020-07-20 NOTE — Progress Notes (Signed)
   Providing Compassionate, Quality Care - Together  NEUROSURGERY PROGRESS NOTE   S: No issues overnight.  O: EXAM:  BP (!) 134/97 (BP Location: Right Arm)   Pulse (!) 102   Temp 98.1 F (36.7 C) (Oral)   Resp 17   Ht 6' 2.02" (1.88 m)   Wt 99.8 kg   SpO2 97%   BMI 28.23 kg/m   Awake, alert, oriented x3 PERRL Speech fluent, appropriate  CNs grossly intact  MAE equally  ASSESSMENT:  30 y.o. male with  1. TBI/tSAH  PLAN: - TBI rehab -pt/ot -No nsx intervention, keppra x7days -no followup needed, will signoff at this time    Thank you for allowing me to participate in this patient's care.  Please do not hesitate to call with questions or concerns.   Monia Pouch, DO Neurosurgeon [facility] Cell: [phone]

## 2020-07-20 NOTE — Plan of Care (Signed)
  Problem: Clinical Measurements: Goal: Respiratory complications will improve Outcome: Progressing Goal: Cardiovascular complication will be avoided Outcome: Progressing   

## 2020-07-20 NOTE — Progress Notes (Signed)
Patient reassessed. Patient sleeping peacefully. Will continue to monitor
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
## 2020-07-20 NOTE — Progress Notes (Signed)
Notified on call Trauma ([place]) concerning patient's increase pain posterior, left and right side of head. Patient states that, "it came on quick". Pain medicine was given earlier in the shift but headache was only posterior at that time. Pain is now a 6 in relation to above locations. Patient given 10 oxy and 2mg  ativan. Will reassess and monitor for any changes. Will notify MD if any changes are seen.

## 2020-07-20 NOTE — Progress Notes (Signed)
MD: please consider restarting patient's home med metoprolol. Patient has not had any since 2/18 per PTA notation
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
## 2020-07-20 NOTE — Progress Notes (Signed)
SLP Cancellation Note  Patient Details Name: Jerry Lewis MRN: [id number] DOB: 1990/08/13   Cancelled treatment:       Reason Eval/Treat Not Completed: Patient at procedure or test/unavailable.  RN reported that pt was unavailable at this time.  SLP will re-attempt as schedule allows.    Jerry Lewis 07/20/2020, 11:50 AM

## 2020-07-20 NOTE — Progress Notes (Signed)
Patient c/o increase pain/swelling on left side of neck and shoulder. This started shortly after working with PT/OT. Patient given ice packs and pain medicine. Will continue to monitor

## 2020-07-20 NOTE — Discharge Instructions (Signed)
Orthopaedic Trauma Service Discharge Instructions   General Discharge Instructions  WEIGHT BEARING STATUS:Non-weightbearing left upper extremity  RANGE OF MOTION/ACTIVITY: Ok for range of motion of left shoulder and elbow as tolerated  Wound Care: Incisions can be left open to air if there is no drainage. If incision continues to have drainage, follow wound care instructions below. Okay to shower if no drainage from incisions.  DVT/PE prophylaxis: Aspirin  Diet: as you were eating previously.  Can use over the counter stool softeners and bowel preparations, such as Miralax, to help with bowel movements.  Narcotics can be constipating.  Be sure to drink plenty of fluids  PAIN MEDICATION USE AND EXPECTATIONS  You have likely been given narcotic medications to help control your pain.  After a traumatic event that results in an fracture (broken bone) with or without surgery, it is ok to use narcotic pain medications to help control one's pain.  We understand that everyone responds to pain differently and each individual patient will be evaluated on a regular basis for the continued need for narcotic medications. Ideally, narcotic medication use should last no more than 6-8 weeks (coinciding with fracture healing).   As a patient it is your responsibility as well to monitor narcotic medication use and report the amount and frequency you use these medications when you come to your office visit.   We would also advise that if you are using narcotic medications, you should take a dose prior to therapy to maximize you participation.  IF YOU ARE ON NARCOTIC MEDICATIONS IT IS NOT PERMISSIBLE TO OPERATE A MOTOR VEHICLE (MOTORCYCLE/CAR/TRUCK/MOPED) OR HEAVY MACHINERY DO NOT MIX NARCOTICS WITH OTHER CNS (CENTRAL NERVOUS SYSTEM) DEPRESSANTS SUCH AS ALCOHOL   STOP SMOKING OR USING NICOTINE PRODUCTS!!!!  As discussed nicotine severely impairs your body's ability to heal surgical and traumatic wounds but  also impairs bone healing.  Wounds and bone heal by forming microscopic blood vessels (angiogenesis) and nicotine is a vasoconstrictor (essentially, shrinks blood vessels).  Therefore, if vasoconstriction occurs to these microscopic blood vessels they essentially disappear and are unable to deliver necessary nutrients to the healing tissue.  This is one modifiable factor that you can do to dramatically increase your chances of healing your injury.    (This means no smoking, no nicotine gum, patches, etc)  DO NOT USE NONSTEROIDAL ANTI-INFLAMMATORY DRUGS (NSAID'S)  Using products such as Advil (ibuprofen), Aleve (naproxen), Motrin (ibuprofen) for additional pain control during fracture healing can delay and/or prevent the healing response.  If you would like to take over the counter (OTC) medication, Tylenol (acetaminophen) is ok.  However, some narcotic medications that are given for pain control contain acetaminophen as well. Therefore, you should not exceed more than 4000 mg of tylenol in a day if you do not have liver disease.  Also note that there are may OTC medicines, such as cold medicines and allergy medicines that my contain tylenol as well.  If you have any questions about medications and/or interactions please ask your doctor/PA or your pharmacist.      ICE AND ELEVATE INJURED/OPERATIVE EXTREMITY  Using ice and elevating the injured extremity above your heart can help with swelling and pain control.  Icing in a pulsatile fashion, such as 20 minutes on and 20 minutes off, can be followed.    Do not place ice directly on skin. Make sure there is a barrier between to skin and the ice pack.    Using frozen items such as frozen peas  works well as the conform nicely to the are that needs to be iced.  USE AN ACE WRAP OR TED HOSE FOR SWELLING CONTROL  In addition to icing and elevation, Ace wraps or TED hose are used to help limit and resolve swelling.  It is recommended to use Ace wraps or TED hose  until you are informed to stop.    When using Ace Wraps start the wrapping distally (farthest away from the body) and wrap proximally (closer to the body)   Example: If you had surgery on your leg or thing and you do not have a splint on, start the ace wrap at the toes and work your way up to the thigh        If you had surgery on your upper extremity and do not have a splint on, start the ace wrap at your fingers and work your way up to the upper arm  CALL THE OFFICE WITH ANY QUESTIONS OR CONCERNS: [phone]   VISIT OUR WEBSITE FOR ADDITIONAL INFORMATION: [email or web address]    Discharge Wound Care Instructions  Do NOT apply any ointments, solutions or lotions to pin sites or surgical wounds.  These prevent needed drainage and even though solutions like hydrogen peroxide kill bacteria, they also damage cells lining the pin sites that help fight infection.  Applying lotions or ointments can keep the wounds moist and can cause them to breakdown and open up as well. This can increase the risk for infection. When in doubt call the office.  Surgical incisions should be dressed daily.  If any drainage is noted, use one layer of adaptic, then gauze, Kerlix, and an ace wrap.  Once the incision is completely dry and without drainage, it may be left open to air out.  Showering may begin 36-48 hours later.  Cleaning gently with soap and water.  Traumatic wounds should be dressed daily as well.    One layer of adaptic, gauze, Kerlix, then ace wrap.  The adaptic can be discontinued once the draining has ceased    If you have a wet to dry dressing: wet the gauze with saline the squeeze as much saline out so the gauze is moist (not soaking wet), place moistened gauze over wound, then place a dry gauze over the moist one, followed by Kerlix wrap, then ace wrap.

## 2020-07-20 NOTE — Progress Notes (Signed)
Inpatient Rehab Admissions Coordinator Note:   Per therapy recommendations, pt was screened for CIR candidacy by Megan Salon, MS CCC-SLP. At this time, Pt. Appears to have no disposition plan and plans to go home alone. I cannot consider him for CIR candidacy without a dispo. If the rehab team feels that Pt. Requires ongoing supervision, he may benefit from rehab at lower level of care.    Megan Salon, MS, CCC-SLP Rehab Admissions Coordinator  [phone] (celll) [phone] (office)
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
## 2020-07-20 NOTE — Progress Notes (Addendum)
Pt requested that his mother, Aggie Cosier, be removed from his list of contacts.  He states he feels she is "calling up here asking questions and making things worse." He does not have any other contact that he wants listed at this time.  Also worth mentioning, as I was updating pt's contact information, he asked what it takes to become a DNR.  I asked if he was inquiring for himself and he said it was "just a general question".  I told him our procedure and the levels of DNR, but also told him that it is a decision not to be taken lightly and, current circumstances aside, he is young and relatively healthy.  He did not inquire any further.  Ortho PA outside of room making note, she was notified of this conversation.
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
## 2020-07-21 MED ORDER — BISACODYL 10 MG RE SUPP
10.0000 mg | Freq: Once | RECTAL | Status: AC
  Administered 2020-07-21: 10 mg via RECTAL
  Filled 2020-07-21: qty 1
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
## 2020-07-21 NOTE — Progress Notes (Signed)
The patient has been agitated all shift regarding his girlfriend being added as visitor and his mother being removed. RN has sought assistance and patient has been reassured multiple times that we are working on getting an answer or solution. The patient increasingly got angry and stated he is leaving. His mother is in facility at this time. Both patient and mother were educated regarding patients physical condition as well as mental condition and were encouraged to stay. They were also informed that during psyche eval yesterday the patient was deemed incompetent to make his own healthcare decisions. Therefor the mother would need to sign him out against medical advise. The surgeon and PA also came to speak with patient and mother. They still refused to stay and mother signed patient out against medical advise. All personal belongings were sent home with patient. He walked out accompanied by his mother. Brynda Rim, RN

## 2021-02-10 ENCOUNTER — Other Ambulatory Visit: Payer: Self-pay

## 2021-02-10 ENCOUNTER — Emergency Department (HOSPITAL_COMMUNITY)
Admission: EM | Admit: 2021-02-10 | Discharge: 2021-02-10 | Disposition: A | Discharge status: Home / Self Care | Payer: No Typology Code available for payment source | Attending: Emergency Medicine | Admitting: Emergency Medicine

## 2021-02-10 ENCOUNTER — Emergency Department (HOSPITAL_COMMUNITY): Admission: EM | Admit: 2021-02-10 | Discharge: 2021-02-10 | Discharge status: Against Medical Advice | Payer: Self-pay

## 2021-02-10 ENCOUNTER — Emergency Department (HOSPITAL_COMMUNITY): Payer: No Typology Code available for payment source

## 2021-02-10 DIAGNOSIS — J3489 Other specified disorders of nose and nasal sinuses: Secondary | ICD-10-CM | POA: Insufficient documentation

## 2021-02-10 DIAGNOSIS — Y92410 Unspecified street and highway as the place of occurrence of the external cause: Secondary | ICD-10-CM | POA: Diagnosis not present

## 2021-02-10 DIAGNOSIS — S069X0A Unspecified intracranial injury without loss of consciousness, initial encounter: Secondary | ICD-10-CM | POA: Insufficient documentation

## 2021-02-10 DIAGNOSIS — S058X2A Other injuries of left eye and orbit, initial encounter: Secondary | ICD-10-CM | POA: Diagnosis present

## 2021-02-10 DIAGNOSIS — S0502XA Injury of conjunctiva and corneal abrasion without foreign body, left eye, initial encounter: Secondary | ICD-10-CM | POA: Insufficient documentation

## 2021-02-10 DIAGNOSIS — S0033XA Contusion of nose, initial encounter: Secondary | ICD-10-CM

## 2021-02-10 MED ORDER — TETRACAINE HCL 0.5 % OP SOLN
2.0000 [drp] | Freq: Once | OPHTHALMIC | Status: AC
  Administered 2021-02-10: 2 [drp] via OPHTHALMIC
  Filled 2021-02-10: qty 4

## 2021-02-10 MED ORDER — FLUORESCEIN SODIUM 1 MG OP STRP
1.0000 | ORAL_STRIP | Freq: Once | OPHTHALMIC | Status: AC
  Administered 2021-02-10: 1 via OPHTHALMIC
  Filled 2021-02-10: qty 1

## 2021-02-10 MED ORDER — GENTAMICIN SULFATE 0.3 % OP SOLN: 2.0000 [drp] | Freq: Four times a day (QID) | OPHTHALMIC | 0 refills | Status: AC

## 2021-02-10 MED ORDER — HYDROCODONE-ACETAMINOPHEN 5-325 MG PO TABS: 1.0000 | ORAL_TABLET | Freq: Four times a day (QID) | ORAL | 0 refills | Status: AC | PRN

## 2021-02-10 NOTE — Discharge Instructions (Addendum)
Begin using gentamicin drops, 2 drops every 4 hours while awake for the next 3 days.  Take ibuprofen 600 mg every 6 hours as needed for pain.  Take hydrocodone as prescribed as needed for pain not relieved with ibuprofen.  Return to the emergency department if you develop worsening pain, worsening vision, or other new and concerning symptoms.
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
## 2022-01-10 ENCOUNTER — Emergency Department (HOSPITAL_COMMUNITY)
Admission: EM | Admit: 2022-01-10 | Discharge: 2022-01-11 | Disposition: A | Discharge status: Home / Self Care | Payer: Self-pay | Attending: Emergency Medicine | Admitting: Emergency Medicine

## 2022-01-10 ENCOUNTER — Other Ambulatory Visit: Payer: Self-pay

## 2022-01-10 ENCOUNTER — Emergency Department (HOSPITAL_COMMUNITY): Payer: Self-pay

## 2022-01-10 ENCOUNTER — Encounter (HOSPITAL_COMMUNITY): Payer: Self-pay | Admitting: *Deleted

## 2022-01-10 DIAGNOSIS — Z5321 Procedure and treatment not carried out due to patient leaving prior to being seen by health care provider: Secondary | ICD-10-CM | POA: Insufficient documentation

## 2022-01-10 DIAGNOSIS — R109 Unspecified abdominal pain: Secondary | ICD-10-CM | POA: Insufficient documentation

## 2022-01-10 DIAGNOSIS — N289 Disorder of kidney and ureter, unspecified: Secondary | ICD-10-CM

## 2022-01-10 DIAGNOSIS — R82998 Other abnormal findings in urine: Secondary | ICD-10-CM | POA: Insufficient documentation

## 2022-01-10 LAB — URINALYSIS, ROUTINE W REFLEX MICROSCOPIC
Bilirubin Urine: NEGATIVE
Glucose, UA: NEGATIVE mg/dL
Ketones, ur: NEGATIVE mg/dL
Leukocytes,Ua: NEGATIVE
Nitrite: NEGATIVE
Protein, ur: 30 mg/dL — AB
RBC / HPF: >50 RBC/hpf — ABNORMAL HIGH (ref 0–5)
Specific Gravity, Urine: 1.019 (ref 1.005–1.030)
pH: 5 (ref 5.0–8.0)

## 2022-01-10 LAB — CBC WITH DIFFERENTIAL/PLATELET
Abs Immature Granulocytes: 0.03 10*3/uL (ref 0.00–0.07)
Basophils Absolute: 0.1 10*3/uL (ref 0.0–0.1)
Basophils Relative: 1 %
Eosinophils Absolute: 0.4 10*3/uL (ref 0.0–0.5)
Eosinophils Relative: 4 %
HCT: 46.5 % (ref 39.0–52.0)
Hemoglobin: 16.8 g/dL (ref 13.0–17.0)
Immature Granulocytes: 0 %
Lymphocytes Relative: 30 %
Lymphs Abs: 2.5 10*3/uL (ref 0.7–4.0)
MCH: 34.9 pg — ABNORMAL HIGH (ref 26.0–34.0)
MCHC: 36.1 g/dL — ABNORMAL HIGH (ref 30.0–36.0)
MCV: 96.7 fL (ref 80.0–100.0)
Monocytes Absolute: 0.7 10*3/uL (ref 0.1–1.0)
Monocytes Relative: 9 %
Neutro Abs: 4.7 10*3/uL (ref 1.7–7.7)
Neutrophils Relative %: 56 %
Platelets: 233 10*3/uL (ref 150–400)
RBC: 4.81 MIL/uL (ref 4.22–5.81)
RDW: 12.4 % (ref 11.5–15.5)
WBC: 8.3 10*3/uL (ref 4.0–10.5)
nRBC: 0 % (ref 0.0–0.2)

## 2022-01-10 LAB — COMPREHENSIVE METABOLIC PANEL
ALT: 40 U/L (ref 0–44)
AST: 31 U/L (ref 15–41)
Albumin: 4.1 g/dL (ref 3.5–5.0)
Alkaline Phosphatase: 85 U/L (ref 38–126)
Anion gap: 11 (ref 5–15)
BUN: 12 mg/dL (ref 6–20)
CO2: 23 mmol/L (ref 22–32)
Calcium: 9.2 mg/dL (ref 8.9–10.3)
Chloride: 104 mmol/L (ref 98–111)
Creatinine, Ser: 1.05 mg/dL (ref 0.61–1.24)
GFR, Estimated: >60 mL/min (ref >60)
Glucose, Bld: 123 mg/dL — ABNORMAL HIGH (ref 70–99)
Potassium: 3.3 mmol/L — ABNORMAL LOW (ref 3.5–5.1)
Sodium: 138 mmol/L (ref 135–145)
Total Bilirubin: 0.5 mg/dL (ref 0.3–1.2)
Total Protein: 6.6 g/dL (ref 6.5–8.1)

## 2022-01-10 LAB — CK: Total CK: 82 U/L (ref 49–397)

## 2022-01-10 MED ORDER — OXYCODONE-ACETAMINOPHEN 5-325 MG PO TABS
1.0000 | ORAL_TABLET | Freq: Once | ORAL | Status: AC
  Administered 2022-01-10: 1 via ORAL
  Filled 2022-01-10: qty 1

## 2022-01-10 NOTE — ED Notes (Signed)
Patient educated about not driving or performing other critical tasks (such as operating heavy machinery, caring for infant/toddler/child) due to sedative nature of narcotic medications received while in the ED.  Pt/caregiver verbalized understanding.   

## 2022-01-10 NOTE — ED Provider Triage Note (Signed)
Emergency Medicine Provider Triage Evaluation Note  Jerry Lewis , a 31 y.o. male  was evaluated in triage.  Pt complains of left flank pain.  Patient reports that he had a sudden onset of left flank pain that started approximate 1 hour prior.  Pain does not radiate.  Patient reports that pain feels similar to previous stones he has had in the right side.  Patient reports he has needed stents in the past.  Patient also reports that this weekend he worked outside [employer].  Patient states that after working outside on Saturday and Sunday he started having "chocolate colored urine."  Patient reports noticing blood in his urine today.  Review of Systems  Positive: Flank pain, hematuria, dysuria, urinary urgency, urinary frequency Negative: Nausea, vomiting, or tenderness to genitals, genital sores or lesions  Physical Exam  BP (!) 149/99 (BP Location: Right Arm)   Pulse 86   Temp 99.4 F (37.4 C) (Oral)   Resp 16   SpO2 96%  Gen:   Awake, no distress   Resp:  Normal effort  MSK:   Moves extremities without difficulty  Other:  Abdomen soft, nondistended, nontender no guarding rebound tenderness.  Positive left CVA tenderness.  Medical Decision Making  Medically screening exam initiated at 7:46 PM.  Appropriate orders placed.  Jerry Lewis was informed that the remainder of the evaluation will be completed by another provider, this initial triage assessment does not replace that evaluation, and the importance of remaining in the ED until their evaluation is complete.     Haskel Schroeder, [place] 01/10/22 1947

## 2022-01-10 NOTE — ED Triage Notes (Signed)
Pt reporting left sided flank pain, dark urine "straight blood in the mornings"

## 2022-01-11 ENCOUNTER — Emergency Department (HOSPITAL_COMMUNITY): Payer: Self-pay

## 2022-01-11 NOTE — ED Notes (Signed)
Patient left without being seen, states he has to go to court at [employer]

## 2022-01-13 LAB — URINE CULTURE: Culture: 30000 — AB

## 2022-01-14 ENCOUNTER — Telehealth: Payer: Self-pay | Admitting: *Deleted

## 2022-01-14 NOTE — Telephone Encounter (Signed)
Post ED Visit - Positive Culture Follow-up: Successful Patient Follow-Up  Culture assessed and recommendations reviewed by:  []  , Pharm.D. []  Enzo Bi, Pharm.D., BCPS AQ-ID []  , Pharm.D., BCPS []  Celedonio Miyamoto, Pharm.D., BCPS []  [place], Garvin Fila.D., BCPS, AAHIVP []  , Pharm.D., BCPS, AAHIVP []  Georgina Pillion, PharmD, BCPS []  , PharmD, BCPS []  [place], PharmD, BCPS []  [address], PharmD  Positive urine culture  [x]  Patient discharged without antimicrobial prescription and treatment is now indicated []  Organism is resistant to prescribed ED discharge antimicrobial []  Patient with positive blood cultures  Changes discussed with ED provider: , PA-C New antibiotic prescription Cefadroxil 1gram PO q12hrs x 10 days Called to Estella Husk   Contacted patient, date 01/14/2022, time 1015   01/14/2022, 10:14 AM

## 2022-01-14 NOTE — Progress Notes (Signed)
ED Antimicrobial Stewardship Positive Culture Follow Up   Jerry Lewis is an 31 y.o. male who presented to [facility] on 01/10/2022 with a chief complaint of  Chief Complaint  Patient presents with   Flank Pain    Recent Results (from the past 720 hour(s))  Urine Culture     Status: Abnormal   Collection Time: 01/10/22  7:58 PM   Specimen: Urine, Clean Catch  Result Value Ref Range Status   Specimen Description URINE, CLEAN CATCH  Final   Special Requests   Final    NONE Performed at [facility], [address] [place], [place] [address]    Culture 30,000 COLONIES/mL ESCHERICHIA COLI (A)  Final   Report Status 01/13/2022 FINAL  Final   Organism ID, Bacteria ESCHERICHIA COLI (A)  Final      Susceptibility   Escherichia coli - MIC*    AMPICILLIN >=32 RESISTANT Resistant     CEFAZOLIN <=4 SENSITIVE Sensitive     CEFEPIME <=0.12 SENSITIVE Sensitive     CEFTRIAXONE <=0.25 SENSITIVE Sensitive     CIPROFLOXACIN <=0.25 SENSITIVE Sensitive     GENTAMICIN <=1 SENSITIVE Sensitive     IMIPENEM <=0.25 SENSITIVE Sensitive     NITROFURANTOIN <=16 SENSITIVE Sensitive     TRIMETH/SULFA <=20 SENSITIVE Sensitive     AMPICILLIN/SULBACTAM >=32 RESISTANT Resistant     PIP/TAZO 8 SENSITIVE Sensitive     * 30,000 COLONIES/mL ESCHERICHIA COLI    [x]  Patient discharged originally without antimicrobial agent  New antibiotic prescription: Flow Manager to call patient. If patient still experiencing urinary symptoms/flank pain, start cefadroxil 1g PO q12h x 10 days. If symptoms have resolved, no further treatment indicated.  ED Provider: , PA-C   Delice Bison Dohlen 01/14/2022, 7:45 AM Clinical Pharmacist Monday - Friday phone -  [phone] Saturday - Sunday phone - [phone]
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
